# Patient Record
Sex: Male | Born: 1985 | Race: White | Hispanic: No
Health system: Midwestern US, Community
[De-identification: ages and names within clinical notes are randomized; demographics above are authoritative.]

---

## 2016-11-11 ENCOUNTER — Inpatient Hospital Stay
Admit: 2016-11-11 | Discharge: 2016-11-12 | Disposition: A | Discharge status: Home / Self Care | Payer: Self-pay | Attending: Emergency Medicine

## 2016-11-11 DIAGNOSIS — T443X1A Poisoning by other parasympatholytics [anticholinergics and antimuscarinics] and spasmolytics, accidental (unintentional), initial encounter: Secondary | ICD-10-CM

## 2016-11-11 LAB — METABOLIC PANEL, COMPREHENSIVE
ALT (SGPT): 19 U/L (ref 12–78)
AST (SGOT): 24 U/L (ref 15–37)
Albumin: 4.4 gm/dl (ref 3.4–5.0)
Alk. phosphatase: 101 U/L (ref 45–117)
Anion gap: 8 mmol/L (ref 5–15)
BUN: 10 mg/dl (ref 7–25)
Bilirubin, total: 0.5 mg/dl (ref 0.2–1.0)
CO2: 26 mEq/L (ref 21–32)
Calcium: 8.8 mg/dl (ref 8.5–10.1)
Chloride: 95 mEq/L — ABNORMAL LOW (ref 98–107)
Creatinine: 1 mg/dl (ref 0.6–1.3)
GFR est AA: >60
GFR est non-AA: >60
Glucose: 81 mg/dl (ref 74–106)
Potassium: 3.3 mEq/L — ABNORMAL LOW (ref 3.5–5.1)
Protein, total: 7.9 gm/dl (ref 6.4–8.2)
Sodium: 129 mEq/L — ABNORMAL LOW (ref 136–145)

## 2016-11-11 LAB — CBC WITH AUTOMATED DIFF
BASOPHILS: 0.6 % (ref 0–3)
EOSINOPHILS: 2.5 % (ref 0–5)
HCT: 44.3 % (ref 37.0–50.0)
HGB: 14.6 gm/dl (ref 12.4–17.2)
IMMATURE GRANULOCYTES: 0.6 % (ref 0.0–3.0)
LYMPHOCYTES: 15.8 % — ABNORMAL LOW (ref 28–48)
MCH: 32.1 pg (ref 23.0–34.6)
MCHC: 33 gm/dl (ref 30.0–36.0)
MCV: 97.4 fL (ref 80.0–98.0)
MONOCYTES: 6.9 % (ref 1–13)
MPV: 9.1 fL (ref 6.0–10.0)
NEUTROPHILS: 73.6 % — ABNORMAL HIGH (ref 34–64)
NRBC: 0 (ref 0–0)
PLATELET: 278 10*3/uL (ref 140–450)
RBC: 4.55 M/uL (ref 3.80–5.70)
RDW-SD: 48.9 — ABNORMAL HIGH (ref 35.1–43.9)
WBC: 10 10*3/uL (ref 4.0–11.0)

## 2016-11-11 LAB — DRUG SCREEN, URINE
Amphetamine: NEGATIVE
Barbiturates: NEGATIVE
Benzodiazepines: NEGATIVE
Cocaine: NEGATIVE
Marijuana: NEGATIVE
Methadone: NEGATIVE
Opiates: NEGATIVE
Phencyclidine: NEGATIVE

## 2016-11-11 LAB — ETHYL ALCOHOL: ALCOHOL(ETHYL),SERUM: <3 mg/dl (ref 0.0–9.0)

## 2016-11-11 LAB — SALICYLATE: Salicylate: <1.7 mg/dl — ABNORMAL LOW (ref 2.8–20.0)

## 2016-11-11 LAB — ACETAMINOPHEN: Acetaminophen level: <2 ug/mL — ABNORMAL LOW (ref 10.0–30.0)

## 2016-11-11 MED ORDER — LORAZEPAM 2 MG/ML IJ SOLN
2 mg/mL | Freq: Once | INTRAMUSCULAR | Status: AC
Start: 2016-11-11 — End: 2016-11-11
  Administered 2016-11-11: 22:00:00 via INTRAVENOUS

## 2016-11-11 MED ORDER — LORAZEPAM 2 MG/ML IJ SOLN
2 mg/mL | INTRAMUSCULAR | Status: AC
Start: 2016-11-11 — End: 2016-11-11
  Administered 2016-11-11: 23:00:00 via INTRAVENOUS

## 2016-11-11 MED ORDER — SODIUM CHLORIDE 0.9 % IJ SYRG
Freq: Once | INTRAMUSCULAR | Status: AC
Start: 2016-11-11 — End: 2016-11-11
  Administered 2016-11-11: 21:00:00 via INTRAVENOUS

## 2016-11-11 MED FILL — LORAZEPAM 2 MG/ML IJ SOLN: 2 mg/mL | INTRAMUSCULAR | Qty: 1

## 2016-11-11 NOTE — ED Provider Notes (Signed)
[place]  Emergency Department Treatment Report        Patient: William Shaw Age: 31 y.o. Sex: male    Date of Birth: 1985-10-09 Admit Date: 11/11/2016 PCP: None   MRN: [id number]  CSN: [id number]  Attending: Martin Majestic, MD   Room: ER10/ER10 Time Dictated: 5:00 PM APP: Fredda Hammed, PA-C     Chief Complaint   Chief Complaint   Patient presents with   ??? Drug Overdose            History of Present Illness   31 y.o. male who presents for evaluation of coricidin overdose onset 6 hours ago (1100 AM). Patient states took twenty four 30 mg tablets of dextromethorphan hydrobromide immediate release tablets with desires to make him hallucinate. He denies suicidal ideations, hallucinations, blurred vision, double vision, abdominal pain, and chest pain. The patient states that he has done this before as a child. States he came in because he feels dehydrated even after drinking 2 gallons of water. He states he urinated once since he has been here. He also notes he is feeling very anxious and nervous that "im going to die".     Review of Systems   Review of Systems   Constitutional: Negative for chills, diaphoresis and fever.   HENT: Negative for congestion, sore throat and tinnitus.    Eyes: Negative for blurred vision and double vision.   Respiratory: Negative for cough and shortness of breath.    Cardiovascular: Negative for chest pain and palpitations.   Gastrointestinal: Negative for abdominal pain, diarrhea, nausea and vomiting.   Genitourinary: Negative for dysuria, frequency and urgency.   Musculoskeletal: Negative for myalgias.   Skin: Negative for itching and rash.   Neurological: Negative for dizziness, tingling and headaches.   Endo/Heme/Allergies: Positive for polydipsia.   Psychiatric/Behavioral: Negative for hallucinations and suicidal ideas.       Past Medical/Surgical History   History reviewed. No pertinent past medical history.   History reviewed. No pertinent surgical history.    Social History     Social History     Social History   ??? Marital status: DIVORCED     Spouse name: N/A   ??? Number of children: N/A   ??? Years of education: N/A     Occupational History   ??? Not on file.     Social History Main Topics   ??? Smoking status: Current Every Day Smoker     Packs/day: 0.50   ??? Smokeless tobacco: Never Used   ??? Alcohol use Not on file   ??? Drug use: Not on file   ??? Sexual activity: Not on file     Other Topics Concern   ??? Not on file     Social History Narrative   ??? No narrative on file       Family History   History reviewed. No pertinent family history.    Current Medications     (Not in a hospital admission)    Allergies     Allergies   Allergen Reactions   ??? Reglan [Metoclopramide] Itching   ??? Ultram [Tramadol] Itching   ??? Zofran Odt [Ondansetron] Itching       Physical Exam   ED Triage Vitals   ED Encounter Vitals Group      BP 11/11/16 1635 133/94      Pulse (Heart Rate) 11/11/16 1635 95      Resp Rate 11/11/16 1635 17  Temp 11/11/16 1635 98.9 ??F (37.2 ??C)      Temp src --       O2 Sat (%) 11/11/16 1635 100 %      Weight 11/11/16 1635 145 lb      Height 11/11/16 1635 6'       Constitutional: Patient appears well developed and well nourished. Appears nontoxic and in no distress. He appears nervous and jittery.  HENT: Normal cephalic and atraumatic.  Mucous membranes moist, non-erythematous and without exudates.   Eyes: Conjunctivae are clear with no discharge. Pupils 3-57m. PERRL.   Neck: Normal ROM, supple and non tender. No nuchal rigidity.   Respiratory: Breath sounds are equal bilaterally. Lungs clear to auscultation with nonlabored respirations. No tachypnea or accessory muscle use.  Cardiovascular: Tachycardic with normal rhythm.   Gastrointestinal: Bowel sounds normal. Abdomen soft and nontender without complaint of pain to palpation. No masses.   Musculoskeletal: Moves all extremities well. No lower extremity edema.    Lymphatic: No cervical adenopathy.  Integumentary: Warm and dry without erythema.  Neurologic: Alert and oriented. No facial asymmetry or dysarthria.     Impression and Management Plan   This is a 31y.o. male with intentional overdose of dextromethorphan for recreational purposes. Patient denies suicidal ideation. He is normotensive and tachycardic.  On exam patient is alert and oriented. He appears nervous and jittery. Mucous membranes are moist. Abdomen soft and nontender. Will obtain appropriate labs and consult poison control.     Diagnostic Studies   Lab:   Recent Results (from the past 12 hour(s))   ETHYL ALCOHOL    Collection Time: 11/11/16  4:39 PM   Result Value Ref Range    ALCOHOL(ETHYL),SERUM <3.0 0.0 - 9.0 mg/dl   METABOLIC PANEL, COMPREHENSIVE    Collection Time: 11/11/16  4:39 PM   Result Value Ref Range    Sodium 129 (L) 136 - 145 mEq/L    Potassium 3.3 (L) 3.5 - 5.1 mEq/L    Chloride 95 (L) 98 - 107 mEq/L    CO2 26 21 - 32 mEq/L    Glucose 81 74 - 106 mg/dl    BUN 10 7 - 25 mg/dl    Creatinine 1.0 0.6 - 1.3 mg/dl    GFR est AA >60      GFR est non-AA >60      Calcium 8.8 8.5 - 10.1 mg/dl    AST (SGOT) 24 15 - 37 U/L    ALT (SGPT) 19 12 - 78 U/L    Alk. phosphatase 101 45 - 117 U/L    Bilirubin, total 0.5 0.2 - 1.0 mg/dl    Protein, total 7.9 6.4 - 8.2 gm/dl    Albumin 4.4 3.4 - 5.0 gm/dl    Anion gap 8 5 - 15 mmol/L   CBC WITH AUTOMATED DIFF    Collection Time: 11/11/16  4:39 PM   Result Value Ref Range    WBC 10.0 4.0 - 11.0 1000/mm3    RBC 4.55 3.80 - 5.70 M/uL    HGB 14.6 12.4 - 17.2 gm/dl    HCT 44.3 37.0 - 50.0 %    MCV 97.4 80.0 - 98.0 fL    MCH 32.1 23.0 - 34.6 pg    MCHC 33.0 30.0 - 36.0 gm/dl    PLATELET 278 140 - 450 1000/mm3    MPV 9.1 6.0 - 10.0 fL    RDW-SD 48.9 (H) 35.1 - 43.9      NRBC 0 0 -  0      IMMATURE GRANULOCYTES 0.6 0.0 - 3.0 %    NEUTROPHILS 73.6 (H) 34 - 64 %    LYMPHOCYTES 15.8 (L) 28 - 48 %    MONOCYTES 6.9 1 - 13 %    EOSINOPHILS 2.5 0 - 5 %    BASOPHILS 0.6 0 - 3 %    ACETAMINOPHEN    Collection Time: 11/11/16  4:39 PM   Result Value Ref Range    Acetaminophen level <2.0 (L) 10.0 - 78.4 mcg/ml   SALICYLATE    Collection Time: 11/11/16  4:39 PM   Result Value Ref Range    Salicylate <6.9 (L) 2.8 - 20.0 mg/dl   DRUG SCREEN, URINE    Collection Time: 11/11/16  5:15 PM   Result Value Ref Range    Amphetamine NEGATIVE NEGATIVE      Barbiturates NEGATIVE NEGATIVE      Benzodiazepines NEGATIVE NEGATIVE      Cocaine NEGATIVE NEGATIVE      Marijuana NEGATIVE NEGATIVE      Methadone NEGATIVE NEGATIVE      Opiates NEGATIVE NEGATIVE      Phencyclidine NEGATIVE NEGATIVE       Labs Reviewed   METABOLIC PANEL, COMPREHENSIVE - Abnormal; Notable for the following:        Result Value    Sodium 129 (*)     Potassium 3.3 (*)     Chloride 95 (*)     All other components within normal limits   CBC WITH AUTOMATED DIFF - Abnormal; Notable for the following:     RDW-SD 48.9 (*)     NEUTROPHILS 73.6 (*)     LYMPHOCYTES 15.8 (*)     All other components within normal limits   ACETAMINOPHEN - Abnormal; Notable for the following:     Acetaminophen level <2.0 (*)     All other components within normal limits   SALICYLATE - Abnormal; Notable for the following:     Salicylate <6.2 (*)     All other components within normal limits   ETHYL ALCOHOL   DRUG SCREEN, URINE       Imaging:    No results found.    EKG: NSR at 92 bpm. PR int. 160 ms, QTc int. 472 ms. No acute ST or T-wave abnormalities that are consistent with acute ischemia or infarction per ED attending.       ED Course/Medical Decision Making   Patient did not develop any new or worsening symptoms and has remained stable during their course in the ED. Poison control was consulted who states only symptomatic/supportive treatment is necessary at this time.  They recommend a 4-6 hour observation period for immediate release and an 8-12 hour observation for extended release.  Patient was given Ativan for  relief of symptoms.  He urinated numerous times while in the department.  Vitals improved after period of Observation.  Labs are notable for hyponatremia which I feel is likely due to consumption of excessive amounts of free water this afternoon.  Instructed patient to recheck labs this week with primary care.  I feel the patient is stable for discharge at this time. Patient was instructed to return to the ER if condition worsens or new symptoms develop. The patient indicates understanding of these issues and agrees with the plan.     Medications   sodium chloride (NS) flush 5-10 mL (10 mL IntraVENous Given 11/11/16 1709)   LORazepam (ATIVAN) injection 1 mg (1 mg IntraVENous  Given 11/11/16 1805)   LORazepam (ATIVAN) injection 1 mg (1 mg IntraVENous Given 11/11/16 1914)       Final Diagnosis       ICD-10-CM ICD-9-CM   1. Anticholinergic drug overdose, accidental or unintentional, initial encounter T44.3X1A 971.1     E855.4       Disposition   Discharged in stable condition.     There are no discharge medications for this patient.      Patient has been evaluated by myself and Martin Majestic, MD who agrees with the above assessment and plan.  Scribe Attestation      Meda Coffee Simms acting as a Education administrator for and in the presence of Fredda Hammed, PA-C  November 11, 2016 at 5:05 PM       Provider Attestation:      I personally performed the services described in the documentation, reviewed the documentation, as recorded by the scribe in my presence, and it accurately and completely records my words and actions. November 11, 2016 at 5:05 PM - Fredda Hammed, PA-C        Fredda Hammed, PA-C  November 11, 2016  5:00 PM      My signature above authenticates this document and my orders, the final ??  diagnosis (es), discharge prescription (s), and instructions in the Epic ??  record.  If you have any questions please contact [phone].  ??  Nursing notes have been reviewed by the physician/ advanced practice ??  Clinician.

## 2016-11-11 NOTE — ED Triage Notes (Addendum)
Pt to er via ems with c/o coricidin overdose. Pt states he took 24 tablets around 1100 today trying to get high. Pt denies SI/HI. Pt states he has done this in the past. Pt states he noticed he took too much because he is now dehydrated. Pt states he has drank around 2 gallons of water but hardly put any out since taking the medication. Pt states tablets were 30 mg each.

## 2016-11-11 NOTE — ED Notes (Signed)
9:00 PM  11/11/16     Discharge instructions given to patient (name) with verbalization of understanding. Patient accompanied by self.  Patient discharged with the following prescriptions none. Patient discharged to home (destination).      William BassetJeremy Shaw

## 2016-11-12 LAB — EKG, 12 LEAD, INITIAL
Atrial Rate: 92 {beats}/min
Calculated P Axis: 74 degrees
Calculated R Axis: 70 degrees
Calculated T Axis: 53 degrees
Diagnosis: NORMAL
P-R Interval: 160 ms
Q-T Interval: 382 ms
QRS Duration: 106 ms
QTC Calculation (Bezet): 472 ms
Ventricular Rate: 92 {beats}/min

## 2016-11-12 LAB — EKG 12-LEAD
Atrial Rate: 92 {beats}/min
Diagnosis: NORMAL
P Axis: 74 degrees
P-R Interval: 160 ms
Q-T Interval: 382 ms
QRS Duration: 106 ms
QTc Calculation (Bazett): 472 ms
R Axis: 70 degrees
T Axis: 53 degrees
Ventricular Rate: 92 {beats}/min

## 2016-11-18 DIAGNOSIS — R112 Nausea with vomiting, unspecified: Secondary | ICD-10-CM

## 2016-11-18 NOTE — ED Triage Notes (Signed)
LLQ abdominal pain with N/V/D

## 2016-11-19 ENCOUNTER — Inpatient Hospital Stay
Admit: 2016-11-19 | Discharge: 2016-11-19 | Disposition: A | Discharge status: Home / Self Care | Payer: Self-pay | Attending: Emergency Medicine

## 2016-11-19 ENCOUNTER — Emergency Department: Admit: 2016-11-19 | Payer: Self-pay

## 2016-11-19 LAB — CBC WITH AUTOMATED DIFF
ABS. BASOPHILS: 0 10*3/uL (ref 0.0–0.06)
ABS. EOSINOPHILS: 0.3 10*3/uL (ref 0.0–0.4)
ABS. LYMPHOCYTES: 2.3 10*3/uL (ref 0.9–3.6)
ABS. MONOCYTES: 0.6 10*3/uL (ref 0.05–1.2)
ABS. NEUTROPHILS: 2.9 10*3/uL (ref 1.8–8.0)
BASOPHILS: 1 % (ref 0–2)
EOSINOPHILS: 6 % — ABNORMAL HIGH (ref 0–5)
HCT: 41.1 % (ref 36.0–48.0)
HGB: 13.1 g/dL (ref 13.0–16.0)
LYMPHOCYTES: 38 % (ref 21–52)
MCH: 32 PG (ref 24.0–34.0)
MCHC: 31.9 g/dL (ref 31.0–37.0)
MCV: 100.2 FL — ABNORMAL HIGH (ref 74.0–97.0)
MONOCYTES: 10 % (ref 3–10)
MPV: 9.1 FL — ABNORMAL LOW (ref 9.2–11.8)
NEUTROPHILS: 45 % (ref 40–73)
PLATELET: 217 10*3/uL (ref 135–420)
RBC: 4.1 M/uL — ABNORMAL LOW (ref 4.70–5.50)
RDW: 13.6 % (ref 11.6–14.5)
WBC: 6.2 10*3/uL (ref 4.6–13.2)

## 2016-11-19 LAB — URINALYSIS W/ RFLX MICROSCOPIC
Bilirubin: NEGATIVE
Glucose: NEGATIVE mg/dL
Ketone: NEGATIVE mg/dL
Leukocyte Esterase: NEGATIVE
Nitrites: NEGATIVE
Protein: NEGATIVE mg/dL
Specific gravity: 1.008 (ref 1.005–1.030)
Urobilinogen: 0.2 EU/dL (ref 0.2–1.0)
pH (UA): 6 (ref 5.0–8.0)

## 2016-11-19 LAB — HEPATIC FUNCTION PANEL
A-G Ratio: 1.2 (ref 0.8–1.7)
ALT (SGPT): 17 U/L (ref 16–61)
AST (SGOT): 16 U/L (ref 15–37)
Albumin: 3.7 g/dL (ref 3.4–5.0)
Alk. phosphatase: 87 U/L (ref 45–117)
Bilirubin, direct: <0.1 MG/DL (ref 0.0–0.2)
Bilirubin, total: 0.2 MG/DL (ref 0.2–1.0)
Globulin: 3 g/dL (ref 2.0–4.0)
Protein, total: 6.7 g/dL (ref 6.4–8.2)

## 2016-11-19 LAB — METABOLIC PANEL, BASIC
Anion gap: 5 mmol/L (ref 3.0–18)
BUN/Creatinine ratio: 10 — ABNORMAL LOW (ref 12–20)
BUN: 10 MG/DL (ref 7.0–18)
CO2: 30 mmol/L (ref 21–32)
Calcium: 8.7 MG/DL (ref 8.5–10.1)
Chloride: 105 mmol/L (ref 100–108)
Creatinine: 1.01 MG/DL (ref 0.6–1.3)
GFR est AA: >60 mL/min/{1.73_m2} (ref >60)
GFR est non-AA: >60 mL/min/{1.73_m2} (ref >60)
Glucose: 76 mg/dL (ref 74–99)
Potassium: 3.7 mmol/L (ref 3.5–5.5)
Sodium: 140 mmol/L (ref 136–145)

## 2016-11-19 LAB — URINE MICROSCOPIC ONLY
Bacteria: NEGATIVE /hpf
RBC: 4 /hpf (ref 0–5)
WBC: NEGATIVE /hpf (ref 0–4)

## 2016-11-19 LAB — LIPASE: Lipase: 134 U/L (ref 73–393)

## 2016-11-19 MED ORDER — PROMETHAZINE 25 MG/ML INJECTION
25 mg/mL | INTRAMUSCULAR | Status: DC
Start: 2016-11-19 — End: 2016-11-19

## 2016-11-19 MED ORDER — MORPHINE 4 MG/ML INTRAVENOUS SOLUTION
4 mg/mL | INTRAVENOUS | Status: AC
Start: 2016-11-19 — End: 2016-11-19
  Administered 2016-11-19: 05:00:00 via INTRAVENOUS

## 2016-11-19 MED ORDER — SODIUM CHLORIDE 0.9% BOLUS IV
0.9 % | Freq: Once | INTRAVENOUS | Status: AC
Start: 2016-11-19 — End: 2016-11-19
  Administered 2016-11-19: 05:00:00 via INTRAVENOUS

## 2016-11-19 MED ORDER — SODIUM CHLORIDE 0.9 % IV
25 mg/mL | Freq: Once | INTRAVENOUS | Status: AC
Start: 2016-11-19 — End: 2016-11-19
  Administered 2016-11-19: 05:00:00 via INTRAVENOUS

## 2016-11-19 MED ORDER — PROMETHAZINE 25 MG TAB
25 mg | ORAL_TABLET | Freq: Four times a day (QID) | ORAL | 0 refills | Status: AC | PRN
Start: 2016-11-19 — End: ?

## 2016-11-19 MED ORDER — ONDANSETRON (PF) 4 MG/2 ML INJECTION
4 mg/2 mL | INTRAMUSCULAR | Status: DC
Start: 2016-11-19 — End: 2016-11-19

## 2016-11-19 MED FILL — PROMETHAZINE 25 MG/ML INJECTION: 25 mg/mL | INTRAMUSCULAR | Qty: 1

## 2016-11-19 MED FILL — MORPHINE 4 MG/ML INTRAVENOUS SOLUTION: 4 mg/mL | INTRAVENOUS | Qty: 1

## 2016-11-19 MED FILL — SODIUM CHLORIDE 0.9 % IV: INTRAVENOUS | Qty: 1000

## 2016-11-19 MED FILL — ONDANSETRON (PF) 4 MG/2 ML INJECTION: 4 mg/2 mL | INTRAMUSCULAR | Qty: 2

## 2016-11-19 NOTE — ED Notes (Signed)
Discharge medications reviewed with patient and appropriate educational materials and side effects teaching were provided. I have reviewed discharge instructions with the patient.  The patient verbalized understanding.

## 2016-11-19 NOTE — ED Provider Notes (Addendum)
Donna Christen  [facility] EMERGENCY DEPT      12:23 AM    Date: 11/18/2016  Patient Name: William Shaw    History of Presenting Illness     Chief Complaint   Patient presents with   ??? Abdominal Pain       History Provided By: Patient    Chief Complaint: abd pain   Duration:  2 days  Timing:  Constant  Location: LLQ  Severity: Moderate  Modifying Factors: none  Associated Symptoms: n/v/d, poor appetite, left lower back pain, urinary frequency    31 y.o. Male, marijuana smoker, alcohol drinker with the noted pmhx, presents to the ED c/o severe, constant LLQ pain that has been ongoing since 2 days ago. Sx are associated with n/v/d, apetite loss, left lower back pain and urinary frequency but the first sx that caused pt discomfort 2 days ago was  his abd pain.     No other complaints/concerns are reported at this time.    Nursing notes regarding the HPI and triage nursing notes were reviewed.     Prior medical records were reviewed.         Past History     Past Medical History:  History reviewed. No pertinent past medical history.    Past Surgical History:  History reviewed. No pertinent surgical history.    Family History:  History reviewed. No pertinent family history.    Social History:  Social History   Substance Use Topics   ??? Smoking status: Current Every Day Smoker     Packs/day: 0.50   ??? Smokeless tobacco: Never Used   ??? Alcohol use No       Allergies:  Allergies   Allergen Reactions   ??? Reglan [Metoclopramide] Itching   ??? Ultram [Tramadol] Itching   ??? Zofran Odt [Ondansetron] Itching       Patient's primary care provider (as noted in EPIC):  None    Review of Systems   Constitutional: Positive for appetite change (poor appetite).   Gastrointestinal: Positive for abdominal pain, diarrhea, nausea and vomiting.   Genitourinary: Positive for frequency.   Musculoskeletal: Positive for back pain (LLQ).   All other systems reviewed and are negative.      Visit Vitals   ??? BP 140/86 (BP Patient Position: At rest)    ??? Pulse (!) 106   ??? Temp 98.1 ??F (36.7 ??C)   ??? Resp 16   ??? Ht 6' (1.829 m)   ??? Wt 68 kg (150 lb)   ??? SpO2 99%   ??? BMI 20.34 kg/m2       Patient Vitals for the past 12 hrs:   Temp Pulse Resp BP SpO2   11/18/16 2310 98.1 ??F (36.7 ??C) (!) 106 16 140/86 99 %       PHYSICAL EXAM:    CONSTITUTIONAL:  Alert, in no apparent distress;  well developed;  well nourished.  HEAD:  Normocephalic, atraumatic.  EYES:  EOMI.  Non-icteric sclera.  Normal conjunctiva.  ENTM:  Nose:  no rhinorrhea.  Throat:  no erythema or exudate, mucous membranes moist.  NECK:  No JVD.  Supple  RESPIRATORY:  Chest clear, equal breath sounds, good air movement.  CARDIOVASCULAR:  Regular rate and rhythm.  No murmurs, rubs, or gallops.    GI:  Normal bowel sounds, abdomen soft with mild LLQ TTP.  No rebound or guarding.    BACK:  Non-tender.  UPPER EXT:  Normal inspection.  LOWER EXT:  No edema,  no calf tenderness.  Distal pulses intact.  NEURO:  Moves all four extremities, and grossly normal motor exam.  SKIN:  No rashes;  Normal for age.  PSYCH:  Alert and normal affect.    DIFFERENTIAL DIAGNOSES/ MEDICAL DECISION MAKING:  Appendicitis, diverticulitis, constipation related pain, obstruction, perforation, abscess, pyelonephritis, urinary tract infection, abdominal wall pain, referred upper abdominal pain, versus combination of the above and/or numerous other processes/ etiologies.      Diagnostic Study Results     Abnormal lab results from this emergency department encounter:  Labs Reviewed   METABOLIC PANEL, BASIC - Abnormal; Notable for the following:        Result Value    BUN/Creatinine ratio 10 (*)     All other components within normal limits   CBC WITH AUTOMATED DIFF - Abnormal; Notable for the following:     RBC 4.10 (*)     MCV 100.2 (*)     MPV 9.1 (*)     EOSINOPHILS 6 (*)     All other components within normal limits   LIPASE   HEPATIC FUNCTION PANEL   URINALYSIS W/ RFLX MICROSCOPIC        Lab values for this patient within approximately the last 12 hours:  Recent Results (from the past 12 hour(s))   METABOLIC PANEL, BASIC    Collection Time: 11/18/16 11:56 PM   Result Value Ref Range    Sodium 140 136 - 145 mmol/L    Potassium 3.7 3.5 - 5.5 mmol/L    Chloride 105 100 - 108 mmol/L    CO2 30 21 - 32 mmol/L    Anion gap 5 3.0 - 18 mmol/L    Glucose 76 74 - 99 mg/dL    BUN 10 7.0 - 18 MG/DL    Creatinine 1.01 0.6 - 1.3 MG/DL    BUN/Creatinine ratio 10 (L) 12 - 20      GFR est AA >60 >60 ml/min/1.16m    GFR est non-AA >60 >60 ml/min/1.774m   Calcium 8.7 8.5 - 10.1 MG/DL   CBC WITH AUTOMATED DIFF    Collection Time: 11/18/16 11:56 PM   Result Value Ref Range    WBC 6.2 4.6 - 13.2 K/uL    RBC 4.10 (L) 4.70 - 5.50 M/uL    HGB 13.1 13.0 - 16.0 g/dL    HCT 41.1 36.0 - 48.0 %    MCV 100.2 (H) 74.0 - 97.0 FL    MCH 32.0 24.0 - 34.0 PG    MCHC 31.9 31.0 - 37.0 g/dL    RDW 13.6 11.6 - 14.5 %    PLATELET 217 135 - 420 K/uL    MPV 9.1 (L) 9.2 - 11.8 FL    NEUTROPHILS 45 40 - 73 %    LYMPHOCYTES 38 21 - 52 %    MONOCYTES 10 3 - 10 %    EOSINOPHILS 6 (H) 0 - 5 %    BASOPHILS 1 0 - 2 %    ABS. NEUTROPHILS 2.9 1.8 - 8.0 K/UL    ABS. LYMPHOCYTES 2.3 0.9 - 3.6 K/UL    ABS. MONOCYTES 0.6 0.05 - 1.2 K/UL    ABS. EOSINOPHILS 0.3 0.0 - 0.4 K/UL    ABS. BASOPHILS 0.0 0.0 - 0.06 K/UL    DF AUTOMATED     LIPASE    Collection Time: 11/18/16 11:56 PM   Result Value Ref Range    Lipase 134 73 - 393 U/L   HEPATIC FUNCTION  PANEL    Collection Time: 11/18/16 11:56 PM   Result Value Ref Range    Protein, total 6.7 6.4 - 8.2 g/dL    Albumin 3.7 3.4 - 5.0 g/dL    Globulin 3.0 2.0 - 4.0 g/dL    A-G Ratio 1.2 0.8 - 1.7      Bilirubin, total 0.2 0.2 - 1.0 MG/DL    Bilirubin, direct <0.1 0.0 - 0.2 MG/DL    Alk. phosphatase 87 45 - 117 U/L    AST (SGOT) 16 15 - 37 U/L    ALT (SGPT) 17 16 - 61 U/L       Radiologist and cardiologist interpretations if available at time of this note:  No results found.    CTD - CT ABD/PELV W/O CON   --------------------------------------------------  Findings:  - No renal stone identified.  - No acute intra abdominal process to explain patients pain.  - Bilateral extra renal pelvis.    Medication(s) ordered for patient during this emergency visit encounter:  Medications   ondansetron [facility]) injection 4 mg (0 mg IntraVENous Held 11/19/16 0108)   promethazine (PHENERGAN) 12.5 mg in 0.9% sodium chloride 50 mL IVPB (12.5 mg IntraVENous Given 11/19/16 0118)   sodium chloride 0.9 % bolus infusion 1,000 mL (1,000 mL IntraVENous New Bag 11/19/16 0107)   morphine injection 4 mg (4 mg IntraVENous Given 11/19/16 0121)       Medical Decision Making     I am the first provider for this patient.    I reviewed the vital signs, available nursing notes, past medical history, past surgical history, family history and social history.    Vital Signs:  Reviewed the patient's vital signs.      ED COURSE AND MEDICAL DECISION MAKING:    The patient's pain improved in the ED with the noted medications.    On reassessment of the patient, the patient continues to have no surgical abdomen with no rebound nor guarding.  The patient does not appear septic by presentation, vital signs and laboratory results.      The patient continues to appear non-toxic in the emergency department on reevaluations.    1:27 AM  IMPRESSION AND MEDICAL DECISION MAKING:  Based upon the patient's presentation with noted HPI and PE, along with the work up done in the emergency department, I believe that the patient is having vomiting, diarrhea, and abdominal pain from gastroenteritis.  I do believe though that the patient is well enough for discharge home.     Will prescribe zofran for nausea and vomiting, and lomotil for diarrhea.  If vicodin was prescribed, only a short course was prescribed for breakthrough pain.     DIAGNOSIS:  1.      Vomiting  2.      Diarrhea  3.      Abdominal pain  4.      Probable gastroenteritis      SPECIFIC PATIENT INSTRUCTIONS FROM THE PHYSICIAN WHO TREATED YOU IN THE ER TODAY:  1.      Phenergan for nausea and/or vomiting.  2.      Over the counter imodium for diarrhea.    3.      Ibuprofen for pain.  4.      FOLLOW UP APPOINTMENT:  Your primary doctor in 3-4 days.    Patient is improved, resting quietly and comfortably.  The patient will be discharged home.     The patient was reassured that these symptoms do not appear to  represent a serious or life threatening condition at this time. Warning signs of worsening condition were discussed and understood by the patient.     Based on patient's age, coexisting illness, exam, and the results of this ED evaluation, the decision to treat as an outpatient was made. Based on the information available at time of discharge, acute pathology requiring immediate intervention was deemed relative unlikely.     While it is impossible to completely exclude the possibility of underlying serious disease or worsening of condition, I feel the relative likelihood is extremely low. I discussed this uncertainty with the patient, who understood ED evaluation and treatment and felt comfortable with the outpatient treatment plan.     All questions regarding care, test results, and follow up were answered. The patient is stable and appropriate to discharge. They understand that they should return to the emergency department for any new or worsening symptoms. I stressed the importance of follow up for repeat assessment and possibly further evaluation/treatment.    Coding Diagnoses     Clinical Impression:   1. Non-intractable vomiting with nausea, unspecified vomiting type    2. Diarrhea, unspecified type    3. Abdominal pain, LLQ (left lower quadrant)        Disposition     Disposition:  Home.    Aaron Edelman L. Leonard Schwartz, M.D.  AB[employer] Board Certified Emergency Physician    Provider Attestation:  If a scribe was utilized in generation of this patient record, I  personally performed the services described in the documentation, reviewed the documentation, as recorded by the scribe in my presence, and it accurately records the patient's history of presenting illness, review of systems, patient physical examination, and procedures performed by me as the attending physician.     Aaron Edelman L. Leonard Schwartz, M.D.  AB[facility] Board Certified Emergency Physician  11/18/2016.  1:27 AM      Scribe Attestation     Cala Bradford acting as a scribe for and in the presence of Lawrence Santiago, MD      July 02, 2069mat 12:27 AM       Provider Attestation:      I personally performed the services described in the documentation, reviewed the documentation, as recorded by the scribe in my presence, and it accurately and completely records my words and actions. November 19, 2016 at 12:27 AM - BLawrence Santiago MD

## 2016-11-19 NOTE — ED Notes (Deleted)
1:47 AM  11/19/16     Discharge instructions given to patient (name) with verbalization of understanding. Patient accompanied by friend.  Patient discharged with the following prescriptions Ibuprofen, Keflex. Patient discharged to home (destination).      Butch PennyKristen M. Gatling, RN
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
